# Patient Record
Sex: Female | Born: 1995 | Race: White | Hispanic: No | Smoking: Former smoker
Health system: Southern US, Community
[De-identification: ages and names within clinical notes are randomized; demographics above are authoritative.]

## PROBLEM LIST (undated history)

## (undated) DIAGNOSIS — F419 Anxiety disorder, unspecified: Secondary | ICD-10-CM

## (undated) DIAGNOSIS — R1110 Vomiting, unspecified: Secondary | ICD-10-CM

## (undated) DIAGNOSIS — F4310 Post-traumatic stress disorder, unspecified: Secondary | ICD-10-CM

## (undated) DIAGNOSIS — F329 Major depressive disorder, single episode, unspecified: Secondary | ICD-10-CM

## (undated) DIAGNOSIS — J45909 Unspecified asthma, uncomplicated: Secondary | ICD-10-CM

## (undated) DIAGNOSIS — F3181 Bipolar II disorder: Secondary | ICD-10-CM

## (undated) DIAGNOSIS — N809 Endometriosis, unspecified: Secondary | ICD-10-CM

## (undated) DIAGNOSIS — F32A Depression, unspecified: Secondary | ICD-10-CM

## (undated) HISTORY — DX: Bipolar II disorder: F31.81

## (undated) HISTORY — DX: Depression, unspecified: F32.A

## (undated) HISTORY — DX: Anxiety disorder, unspecified: F41.9

## (undated) HISTORY — DX: Vomiting, unspecified: R11.10

## (undated) HISTORY — DX: Post-traumatic stress disorder, unspecified: F43.10

## (undated) HISTORY — PX: NO PAST SURGERIES: SHX2092

---
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
## 2019-12-04 ENCOUNTER — Ambulatory Visit (INDEPENDENT_AMBULATORY_CARE_PROVIDER_SITE_OTHER): Payer: BC Managed Care – PPO | Admitting: Advanced Practice Midwife

## 2019-12-04 ENCOUNTER — Other Ambulatory Visit: Payer: Self-pay

## 2019-12-04 ENCOUNTER — Encounter: Payer: Self-pay | Admitting: Advanced Practice Midwife

## 2019-12-04 VITALS — BP 118/74 | Wt 141.0 lb

## 2019-12-04 DIAGNOSIS — Z3402 Encounter for supervision of normal first pregnancy, second trimester: Secondary | ICD-10-CM | POA: Insufficient documentation

## 2019-12-04 DIAGNOSIS — Z3A21 21 weeks gestation of pregnancy: Secondary | ICD-10-CM

## 2019-12-04 NOTE — Progress Notes (Signed)
New Obstetric Patient H&P  Transfer of care from [facility]: "Desires prenatal care"   History of Present Illness: Patient is a 24 y.o. G1P0 Not Hispanic or Courtney Ramos female, presents with amenorrhea and positive home pregnancy test. Patient's last menstrual period was 06/28/2019. and based on 6 week ultrasound per patient, her EDD is Estimated Date of Delivery: 04/14/20 and her EGA is [redacted]w[redacted]d. Cycles are 3-4 days, regular, and occur approximately every : 28 days. Her last pap smear was about 6 months ago and was no abnormalities.    She had a urine pregnancy test which was positive 4 month(s)  ago. Her last menstrual period was normal and lasted for  3 or 4 day(s). Since her LMP she claims she has experienced fatigue, nausea, vomiting. She denies vaginal bleeding. Her past medical history is contributory for Bipolar, PTSD, depression. She has completed a treatment program that she reports was very helpful giving her good coping skills. She reports a history of endometriosis and was told she would be unable to conceive. This is her first pregnancy.  Since her LMP, she admits to the use of tobacco products  no She claims she has gained   1 pounds since the start of her pregnancy. She initially lost 20 pounds due to hyperemesis and has since gained that back plus 1 pound. There are cats in the home in the home  yes If yes Indoor- her fiance takes care of litter box She admits close contact with children on a regular basis  no  She has had chicken pox in the past no She has had Tuberculosis exposures, symptoms, or previously tested positive for TB   no Current or past history of domestic violence. no  Genetic Screening/Teratology Counseling: (Includes patient, baby's father, or anyone in either family with:)   38. Patient's age >/= 49 [facility]  no 2. Thalassemia ([place], [place], [place], or Asian background): MCV<80  no 3. Neural tube defect (meningomyelocele, spina bifida,  anencephaly)  no 4. Congenital heart defect  no  5. Down syndrome  no 6. Tay-Sachs (Jewish, [place])  no 7. Canavan's Disease  no 8. Sickle cell disease or trait (African)  no  9. Hemophilia or other blood disorders  no  10. Muscular dystrophy  no  11. Cystic fibrosis  no  12. Huntington's Chorea  no  13. Mental retardation/autism  no 14. Other inherited genetic or chromosomal disorder  no 15. Maternal metabolic disorder (DM, PKU, etc)  no 16. Patient or FOB with a child with a birth defect not listed above no  16a. Patient or FOB with a birth defect themselves no 17. Recurrent pregnancy loss, or stillbirth  no  18. Any medications since LMP other than prenatal vitamins (include vitamins, supplements, OTC meds, drugs, alcohol)  She quit taking Depakote, Propranolol and Maxalt when she found out she was pregnant. She does take Atarax PRN, Fioricet and Magnesium supplement for headaches 19. Any other genetic/environmental exposure to discuss  no  Infection History:   1. Lives with someone with TB or TB exposed  no  2. Patient or partner has history of genital herpes  no 3. Rash or viral illness since LMP  no 4. History of STI (GC, CT, HPV, syphilis, HIV)  no 5. History of recent travel :  no  Other pertinent information:  no    Review of Systems:10 point review of systems negative unless otherwise noted in HPI  Past Medical History:  Patient Active  Problem List   Diagnosis Date Noted  . Encounter for supervision of normal first pregnancy in second trimester 12/04/2019  . Allergic rhinitis 12/03/2015  . 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 Headache, migraine 12/03/2015    Past Surgical History:  Past Surgical History:  Procedure Laterality Date  . NO PAST SURGERIES      Gynecologic History: Patient's last menstrual period was 06/28/2019.  Obstetric History: G1P0  Family History:  History reviewed. No pertinent family history.  Social History:  Social  History   Socioeconomic History  . Marital status: Single    Spouse name: Not on file  . Number of children: Not on file  . Years of education: Not on file  . Highest education level: Not on file  Occupational History  . Not on file  Tobacco Use  . Smoking status: Former Smoker    Types: Cigarettes  . Smokeless tobacco: Never Used  . Tobacco comment: stopped before pregnancy  Substance and Sexual Activity  . Alcohol use: Never  . Drug use: Never  . Sexual activity: Yes    Birth control/protection: None  Other Topics Concern  . Not on file  Social History Narrative  . Not on file   Social Determinants of Health   Financial Resource Strain:   . Difficulty of Paying Living Expenses:   Food Insecurity:   . Worried About Programme researcher, broadcasting/film/video in the Last Year:   . Barista in the Last Year:   Transportation Needs:   . Freight forwarder (Medical):   Marland Kitchen Lack of Transportation (Non-Medical):   Physical Activity:   . Days of Exercise per Week:   . Minutes of Exercise per Session:   Stress:   . Feeling of Stress :   Social Connections:   . Frequency of Communication with Friends and Family:   . Frequency of Social Gatherings with Friends and Family:   . Attends Religious Services:   . Active Member of Clubs or Organizations:   . Attends Banker Meetings:   Marland Kitchen Marital Status:   Intimate Partner Violence:   . Fear of Current or Ex-Partner:   . Emotionally Abused:   Marland Kitchen Physically Abused:   . Sexually Abused:     Allergies:  No Known Allergies  Medications: Prior to Admission medications   Medication Sig Start Date End Date Taking? Authorizing Provider  hydrOXYzine (ATARAX/VISTARIL) 50 MG tablet  12/01/19  Yes [provider]  magnesium 30 MG tablet Take 30 mg by mouth 2 (two) times daily.   Yes [provider]  Prenatal Vit-Fe Fumarate-FA (MULTIVITAMIN-PRENATAL) 27-0.8 MG TABS tablet Take 1 tablet by mouth daily at 12 noon.   Yes  [provider]  butalbital-acetaminophen-caffeine (FIORICET) [phone] MG tablet  11/29/19   [provider]    Physical Exam Vitals: Blood pressure 118/74, weight 141 lb (64 kg), last menstrual period 06/28/2019.  General: NAD HEENT: normocephalic, anicteric Thyroid: no enlargement, no palpable nodules Pulmonary: No increased work of breathing, CTAB Cardiovascular: RRR, distal pulses 2+ Abdomen: NABS, soft, non-tender, non-distended.  Umbilicus without lesions.  No hepatomegaly, splenomegaly or masses palpable. No evidence of hernia  Genitourinary: deferred for no concerns/all NOB labs have been done including PAPtima Extremities: no edema, erythema, or tenderness Neurologic: Grossly intact Psychiatric: mood appropriate, affect full   Assessment: 24 y.o. G1P0 at [redacted]w[redacted]d by ultrasound presenting to initiate prenatal care with [facility]  Plan: 1) Avoid alcoholic beverages. 2) Patient encouraged not to smoke.  3) Discontinue the use of all non-medicinal drugs and chemicals.  4) Take prenatal vitamins daily.  5) Nutrition, food safety (fish, cheese advisories, and high nitrite foods) and exercise discussed. 6) Hospital and practice style discussed with cross coverage system.  7) Genetic Screening, such as with 1st Trimester Screening, cell free fetal DNA, AFP testing, and Ultrasound, as well as with amniocentesis and CVS as appropriate, is discussed with patient. At the conclusion of today's visit patient declined genetic testing. She had previously declined at former provider practice 8) Patient is asked about travel to areas at risk for the [place] virus, and counseled to avoid travel and exposure to mosquitoes or sexual partners who may have themselves been exposed to the virus. Testing is discussed, and will be ordered as appropriate. 9) Request for records signed and sent: will update chart when records arrive 10) Return to clinic in 1 week for anatomy scan and rob     Tresea Mall, CNM [facility] 12/04/2019, 11:57 AM

## 2019-12-04 NOTE — Patient Instructions (Signed)
Exercise During Pregnancy Exercise is an important part of being healthy for people of all ages. Exercise improves the function of your heart and lungs and helps you maintain strength, flexibility, and a healthy body weight. Exercise also boosts energy levels and elevates mood. Most women should exercise regularly during pregnancy. In rare cases, women with certain medical conditions or complications may be asked to limit or avoid exercise during pregnancy. How does this affect me? Along with maintaining general strength and flexibility, exercising during pregnancy can help:  Keep strength in muscles that are used during labor and childbirth.  Decrease low back pain.  Reduce symptoms of depression.  Control weight gain during pregnancy.  Reduce the risk of needing insulin if you develop diabetes during pregnancy.  Decrease the risk of cesarean delivery.  Speed up your recovery after giving birth. How does this affect my baby? Exercise can help you have a healthy pregnancy. Exercise does not cause premature birth. It will not cause your baby to weigh less at birth. What exercises can I do? Many exercises are safe for you to do during pregnancy. Do a variety of exercises that safely increase your heart and breathing rates and help you build and maintain muscle strength. Do exercises exactly as told by your health care provider. You may do these exercises:  Walking or hiking.  Swimming.  Water aerobics.  Riding a stationary bike.  Strength training.  Modified yoga or Pilates. Tell your instructor that you are pregnant. Avoid overstretching, and avoid lying on your back for long periods of time.  Running or jogging. Only choose this type of exercise if you: ? Ran or jogged regularly before your pregnancy. ? Can run or jog and still talk in complete sentences. What exercises should I avoid? Depending on your level of fitness and whether you exercised regularly before your  pregnancy, you may be told to limit high-intensity exercise. You can tell that you are exercising at a high intensity if you are breathing much harder and faster and cannot hold a conversation while exercising. You must avoid:  Contact sports.  Activities that put you at risk for falling on or being hit in the belly, such as downhill skiing, water skiing, surfing, rock climbing, cycling, gymnastics, and horseback riding.  Scuba diving.  Skydiving.  Yoga or Pilates in a room that is heated to high temperatures.  Jogging or running, unless you ran or jogged regularly before your pregnancy. While jogging or running, you should always be able to talk in full sentences. Do not run or jog so fast that you are unable to have a conversation.  Do not exercise at more than 6,000 feet above sea level (high elevation) if you are not used to exercising at high elevation. How do I exercise in a safe way?   Avoid overheating. Do not exercise in very high temperatures.  Wear loose-fitting, breathable clothes.  Avoid dehydration. Drink enough water before, during, and after exercise to keep your urine pale yellow.  Avoid overstretching. Because of hormone changes during pregnancy, it is easy to overstretch muscles, tendons, and ligaments during pregnancy.  Start slowly and ask your health care provider to recommend the types of exercise that are safe for you.  Do not exercise to lose weight. Follow these instructions at home:  Exercise on most days or all days of the week. Try to exercise for 30 minutes a day, 5 days a week, unless your health care provider tells you not to.  If   you actively exercised before your pregnancy and you are healthy, your health care provider may tell you to continue to do moderate to high-intensity exercise.  If you are just starting to exercise or did not exercise much before your pregnancy, your health care provider may tell you to do low to moderate-intensity  exercise. Questions to ask your health care provider  Is exercise safe for me?  What are signs that I should stop exercising?  Does my health condition mean that I should not exercise during pregnancy?  When should I avoid exercising during pregnancy? Stop exercising and contact a health care provider if: You have any unusual symptoms, such as:  Mild contractions of the uterus or cramps in the abdomen.  Dizziness that does not go away when you rest. Stop exercising and get help right away if: You have any unusual symptoms, such as:  Sudden, severe pain in your low back or your belly.  Mild contractions of the uterus or cramps in the abdomen that do not improve with rest and drinking fluids.  Chest pain.  Bleeding or fluid leaking from your vagina.  Shortness of breath. These symptoms may represent a serious problem that is an emergency. Do not wait to see if the symptoms will go away. Get medical help right away. Call your local emergency services (911 in the [place] Do not drive yourself to the hospital. Summary  Most women should exercise regularly throughout pregnancy. In rare cases, women with certain medical conditions or complications may be asked to limit or avoid exercise during pregnancy.  Do not exercise to lose weight during pregnancy.  Your health care provider will tell you what level of physical activity is right for you.  Stop exercising and contact a health care provider if you have mild contractions of the uterus or cramps in the abdomen. Get help right away if these contractions or cramps do not improve with rest and drinking fluids.  Stop exercising and get help right away if you have sudden, severe pain in your low back or belly, chest pain, shortness of breath, or bleeding or leaking of fluid from your vagina. This information is not intended to replace advice given to you by your health care provider. Make sure you discuss any questions you have with your  health care provider. Document Revised: 10/11/2018 Document Reviewed: 07/25/2018 Elsevier Patient Education  2020 [employer] Eating Plan for Pregnant Women While you are pregnant, your body requires additional nutrition to help support your growing baby. You also have a higher need for some vitamins and minerals, such as folic acid, calcium, iron, and vitamin D. Eating a healthy, well-balanced diet is very important for your health and your baby's health. Your need for extra calories varies for the three 3-month segments of your pregnancy (trimesters). For most women, it is recommended to consume:  150 extra calories a day during the first trimester.  300 extra calories a day during the second trimester.  300 extra calories a day during the third trimester. What are tips for following this plan?   Do not try to lose weight or go on a diet during pregnancy.  Limit your overall intake of foods that have "empty calories." These are foods that have little nutritional value, such as sweets, desserts, candies, and sugar-sweetened beverages.  Eat a variety of foods (especially fruits and vegetables) to get a full range of vitamins and minerals.  Take a prenatal vitamin to help meet your additional vitamin and mineral needs   during pregnancy, specifically for folic acid, iron, calcium, and vitamin D.  Remember to stay active. Ask your health care provider what types of exercise and activities are safe for you.  Practice good food safety and cleanliness. Wash your hands before you eat and after you prepare raw meat. Wash all fruits and vegetables well before peeling or eating. Taking these actions can help to prevent food-borne illnesses that can be very dangerous to your baby, such as listeriosis. Ask your health care provider for more information about listeriosis. What does 150 extra calories look like? Healthy options that provide 150 extra calories each day could be any of the  following:  6-8 oz (170-230 g) of plain low-fat yogurt with  cup of berries.  1 apple with 2 teaspoons (11 g) of peanut butter.  Cut-up vegetables with  cup (60 g) of hummus.  8 oz (230 mL) or 1 cup of low-fat chocolate milk.  1 stick of string cheese with 1 medium orange.  1 peanut butter and jelly sandwich that is made with one slice of whole-wheat bread and 1 tsp (5 g) of peanut butter. For 300 extra calories, you could eat two of those healthy options each day. What is a healthy amount of weight to gain? The right amount of weight gain for you is based on your BMI before you became pregnant. If your BMI:  Was less than 18 (underweight), you should gain 28-40 lb (13-18 kg).  Was 18-24.9 (normal), you should gain 25-35 lb (11-16 kg).  Was 25-29.9 (overweight), you should gain 15-25 lb (7-11 kg).  Was 30 or greater (obese), you should gain 11-20 lb (5-9 kg). What if I am having twins or multiples? Generally, if you are carrying twins or multiples:  You may need to eat 300-600 extra calories a day.  The recommended range for total weight gain is 25-54 lb (11-25 kg), depending on your BMI before pregnancy.  Talk with your health care provider to find out about nutritional needs, weight gain, and exercise that is right for you. What foods can I eat?  Fruits All fruits. Eat a variety of colors and types of fruit. Remember to wash your fruits well before peeling or eating. Vegetables All vegetables. Eat a variety of colors and types of vegetables. Remember to wash your vegetables well before peeling or eating. Grains All grains. Choose whole grains, such as whole-wheat bread, oatmeal, or brown rice. Meats and other protein foods Lean meats, including chicken, turkey, fish, and lean cuts of beef, veal, or pork. If you eat fish or seafood, choose options that are higher in omega-3 fatty acids and lower in mercury, such as salmon, herring, mussels, trout, sardines, pollock,  shrimp, crab, and lobster. Tofu. Tempeh. Beans. Eggs. Peanut butter and other nut butters. Make sure that all meats, poultry, and eggs are cooked to food-safe temperatures or "well-done." Two or more servings of fish are recommended each week in order to get the most benefits from omega-3 fatty acids that are found in seafood. Choose fish that are lower in mercury. You can find more information online:  www.fda.gov Dairy Pasteurized milk and milk alternatives (such as almond milk). Pasteurized yogurt and pasteurized cheese. Cottage cheese. Sour cream. Beverages Water. Juices that contain 100% fruit juice or vegetable juice. Caffeine-free teas and decaffeinated coffee. Drinks that contain caffeine are okay to drink, but it is better to avoid caffeine. Keep your total caffeine intake to less than 200 mg each day (which is 12 oz   or 355 mL of coffee, tea, or soda) or the limit as told by your health care provider. Fats and oils Fats and oils are okay to include in moderation. Sweets and desserts Sweets and desserts are okay to include in moderation. Seasoning and other foods All pasteurized condiments. The items listed above may not be a complete list of foods and beverages you can eat. Contact a dietitian for more information. What foods are not recommended? Fruits Unpasteurized fruit juices. Vegetables Raw (unpasteurized) vegetable juices. Meats and other protein foods Lunch meats, bologna, hot dogs, or other deli meats. (If you must eat those meats, reheat them until they are steaming hot.) Refrigerated pat, meat spreads from a meat counter, smoked seafood that is found in the refrigerated section of a store. Raw or undercooked meats, poultry, and eggs. Raw fish, such as sushi or sashimi. Fish that have high mercury content, such as tilefish, shark, swordfish, and king mackerel. To learn more about mercury in fish, talk with your health care provider or look for online resources, such  as:  www.fda.gov Dairy Raw (unpasteurized) milk and any foods that have raw milk in them. Soft cheeses, such as feta, queso blanco, queso fresco, Brie, Camembert cheeses, blue-veined cheeses, and Panela cheese (unless it is made with pasteurized milk, which must be stated on the label). Beverages Alcohol. Sugar-sweetened beverages, such as sodas, teas, or energy drinks. Seasoning and other foods Homemade fermented foods and drinks, such as pickles, sauerkraut, or kombucha drinks. (Store-bought pasteurized versions of these are okay.) Salads that are made in a store or deli, such as ham salad, chicken salad, egg salad, tuna salad, and seafood salad. The items listed above may not be a complete list of foods and beverages you should avoid. Contact a dietitian for more information. Where to find more information To calculate the number of calories you need based on your height, weight, and activity level, you can use an online calculator such as:  www.choosemyplate.gov/MyPlatePlan To calculate how much weight you should gain during pregnancy, you can use an online pregnancy weight gain calculator such as:  www.choosemyplate.gov/pregnancy-weight-gain-calculator Summary  While you are pregnant, your body requires additional nutrition to help support your growing baby.  Eat a variety of foods, especially fruits and vegetables to get a full range of vitamins and minerals.  Practice good food safety and cleanliness. Wash your hands before you eat and after you prepare raw meat. Wash all fruits and vegetables well before peeling or eating. Taking these actions can help to prevent food-borne illnesses, such as listeriosis, that can be very dangerous to your baby.  Do not eat raw meat or fish. Do not eat fish that have high mercury content, such as tilefish, shark, swordfish, and king mackerel. Do not eat unpasteurized (raw) dairy.  Take a prenatal vitamin to help meet your additional vitamin and  mineral needs during pregnancy, specifically for folic acid, iron, calcium, and vitamin D. This information is not intended to replace advice given to you by your health care provider. Make sure you discuss any questions you have with your health care provider. Document Revised: 11/08/2018 Document Reviewed: 03/17/2017 Elsevier Patient Education  2020 [employer]. Prenatal Care Prenatal care is health care during pregnancy. It helps you and your unborn baby (fetus) stay as healthy as possible. Prenatal care may be provided by a midwife, a family practice health care provider, or a childbirth and pregnancy specialist (obstetrician). How does this affect me? During pregnancy, you will be closely monitored   for any new conditions that might develop. To lower your risk of pregnancy complications, you and your health care provider will talk about any underlying conditions you have. How does this affect my baby? Early and consistent prenatal care increases the chance that your baby will be healthy during pregnancy. Prenatal care lowers the risk that your baby will be:  Born early (prematurely).  Smaller than expected at birth (small for gestational age). What can I expect at the first prenatal care visit? Your first prenatal care visit will likely be the longest. You should schedule your first prenatal care visit as soon as you know that you are pregnant. Your first visit is a good time to talk about any questions or concerns you have about pregnancy. At your visit, you and your health care provider will talk about:  Your medical history, including: ? Any past pregnancies. ? Your family's medical history. ? The baby's father's medical history. ? Any long-term (chronic) health conditions you have and how you manage them. ? Any surgeries or procedures you have had. ? Any current over-the-counter or prescription medicines, herbs, or supplements you are taking.  Other factors that could pose a risk  to your baby, including:  Your home setting and your stress levels, including: ? Exposure to abuse or violence. ? Household financial strain. ? Mental health conditions you have.  Your daily health habits, including diet and exercise. Your health care provider will also:  Measure your weight, height, and blood pressure.  Do a physical exam, including a pelvic and breast exam.  Perform blood tests and urine tests to check for: ? Urinary tract infection. ? Sexually transmitted infections (STIs). ? Low iron levels in your blood (anemia). ? Blood type and certain proteins on red blood cells (Rh antibodies). ? Infections and immunity to viruses, such as hepatitis B and rubella. ? HIV (human immunodeficiency virus).  Do an ultrasound to confirm your baby's growth and development and to help predict your estimated due date (EDD). This ultrasound is done with a probe that is inserted into the vagina (transvaginal ultrasound).  Discuss your options for genetic screening.  Give you information about how to keep yourself and your baby healthy, including: ? Nutrition and taking vitamins. ? Physical activity. ? How to manage pregnancy symptoms such as nausea and vomiting (morning sickness). ? Infections and substances that may be harmful to your baby and how to avoid them. ? Food safety. ? Dental care. ? Working. ? Travel. ? Warning signs to watch for and when to call your health care provider. How often will I have prenatal care visits? After your first prenatal care visit, you will have regular visits throughout your pregnancy. The visit schedule is often as follows:  Up to week 28 of pregnancy: once every 4 weeks.  28-36 weeks: once every 2 weeks.  After 36 weeks: every week until delivery. Some women may have visits more or less often depending on any underlying health conditions and the health of the baby. Keep all follow-up and prenatal care visits as told by your health care  provider. This is important. What happens during routine prenatal care visits? Your health care provider will:  Measure your weight and blood pressure.  Check for fetal heart sounds.  Measure the height of your uterus in your abdomen (fundal height). This may be measured starting around week 20 of pregnancy.  Check the position of your baby inside your uterus.  Ask questions about your diet, sleeping patterns, and   whether you can feel the baby move.  Review warning signs to watch for and signs of labor.  Ask about any pregnancy symptoms you are having and how you are dealing with them. Symptoms may include: ? Headaches. ? Nausea and vomiting. ? Vaginal discharge. ? Swelling. ? Fatigue. ? Constipation. ? Any discomfort, including back or pelvic pain. Make a list of questions to ask your health care provider at your routine visits. What tests might I have during prenatal care visits? You may have blood, urine, and imaging tests throughout your pregnancy, such as:  Urine tests to check for glucose, protein, or signs of infection.  Glucose tests to check for a form of diabetes that can develop during pregnancy (gestational diabetes mellitus). This is usually done around week 24 of pregnancy.  An ultrasound to check your baby's growth and development and to check for birth defects. This is usually done around week 20 of pregnancy.  A test to check for group B strep (GBS) infection. This is usually done around week 36 of pregnancy.  Genetic testing. This may include blood or imaging tests, such as an ultrasound. Some genetic tests are done during the first trimester and some are done during the second trimester. What else can I expect during prenatal care visits? Your health care provider may recommend getting certain vaccines during pregnancy. These may include:  A yearly flu shot (annual influenza vaccine). This is especially important if you will be pregnant during flu  season.  Tdap (tetanus, diphtheria, pertussis) vaccine. Getting this vaccine during pregnancy can protect your baby from whooping cough (pertussis) after birth. This vaccine may be recommended between weeks 27 and 36 of pregnancy. Later in your pregnancy, your health care provider may give you information about:  Childbirth and breastfeeding classes.  Choosing a health care provider for your baby.  Umbilical cord banking.  Breastfeeding.  Birth control after your baby is born.  The hospital labor and delivery unit and how to tour it.  Registering at the hospital before you go into labor. Where to find more information  Office on Women's Health: womenshealth.gov  American Pregnancy Association: americanpregnancy.org  March of Dimes: marchofdimes.org Summary  Prenatal care helps you and your baby stay as healthy as possible during pregnancy.  Your first prenatal care visit will most likely be the longest.  You will have visits and tests throughout your pregnancy to monitor your health and your baby's health.  Bring a list of questions to your visits to ask your health care provider.  Make sure to keep all follow-up and prenatal care visits with your health care provider. This information is not intended to replace advice given to you by your health care provider. Make sure you discuss any questions you have with your health care provider. Document Revised: 10/10/2018 Document Reviewed: 06/19/2017 Elsevier Patient Education  2020 [employer]  

## 2019-12-04 NOTE — Progress Notes (Signed)
NOB today, transfer of care. Need anatomy scan.

## 2019-12-07 ENCOUNTER — Other Ambulatory Visit: Payer: Self-pay

## 2019-12-07 ENCOUNTER — Emergency Department
Admission: EM | Admit: 2019-12-07 | Discharge: 2019-12-07 | Disposition: A | Discharge status: Home / Self Care | Payer: BC Managed Care – PPO | Attending: Emergency Medicine | Admitting: Emergency Medicine

## 2019-12-07 DIAGNOSIS — R05 Cough: Secondary | ICD-10-CM | POA: Diagnosis present

## 2019-12-07 DIAGNOSIS — Z87891 Personal history of nicotine dependence: Secondary | ICD-10-CM | POA: Insufficient documentation

## 2019-12-07 DIAGNOSIS — J029 Acute pharyngitis, unspecified: Secondary | ICD-10-CM | POA: Insufficient documentation

## 2019-12-07 DIAGNOSIS — H65111 Acute and subacute allergic otitis media (mucoid) (sanguinous) (serous), right ear: Secondary | ICD-10-CM | POA: Diagnosis not present

## 2019-12-07 DIAGNOSIS — Z20822 Contact with and (suspected) exposure to covid-19: Secondary | ICD-10-CM | POA: Diagnosis not present

## 2019-12-07 DIAGNOSIS — J069 Acute upper respiratory infection, unspecified: Secondary | ICD-10-CM | POA: Diagnosis not present

## 2019-12-07 LAB — GROUP A STREP BY PCR: Group A Strep by PCR: NOT DETECTED

## 2019-12-07 LAB — SARS CORONAVIRUS 2 BY RT PCR (HOSPITAL ORDER, PERFORMED IN ~~LOC~~ HOSPITAL LAB): SARS Coronavirus 2: NEGATIVE

## 2019-12-07 MED ORDER — PROMETHAZINE-CODEINE 6.25-10 MG/5ML PO SYRP: 5.0000 mL | ORAL_SOLUTION | Freq: Four times a day (QID) | ORAL | 0 refills | Status: DC | PRN

## 2019-12-07 MED ORDER — AMOXICILLIN 500 MG PO CAPS
500.0000 mg | ORAL_CAPSULE | Freq: Three times a day (TID) | ORAL | 0 refills | Status: DC
Start: 2019-12-07 — End: 2020-01-07

## 2019-12-07 NOTE — ED Notes (Signed)
See triage note- pt [redacted] weeks pregnant, states she is having upper respiratory symptoms and some shortness of breath when she has coughing fits- states she can breathe fine with resting and sitting straight up. Pt in NAD at this time.

## 2019-12-07 NOTE — Discharge Instructions (Addendum)
Follow discharge care instruction take medication as directed.  Advised self quarantine pending results of COVID-19 test.  If test is positive quarantine additional 10 days.  Test results may be found in the MyChart app.

## 2019-12-07 NOTE — ED Provider Notes (Signed)
[facility] Emergency Department Provider Note   ____________________________________________   First MD Initiated Contact with Patient 12/07/19 1123     (approximate)  I have reviewed the triage vital signs and the nursing notes.   HISTORY  Chief Complaint URI    HPI Courtney Ramos is a 24 y.o. female patient complain of cough, bilateral ear pain, sore throat, shortness of breath for 3 days.  Patient is [redacted] weeks gestation with a recent relocation last month to this city patient is G1, P0.  Patient denies known contact with COVID-19.  Patient denies fever associated with complaint.  Patient denies nausea, vomiting, diarrhea.  Patient states cough is productive.         Past Medical History:  Diagnosis Date  . Anxiety   . Bipolar 2 disorder ([place])   . Depression   . Hyperemesis   . PTSD (post-traumatic stress disorder)     Patient Active Problem List   Diagnosis Date Noted  . Encounter for supervision of normal first pregnancy in second trimester 12/04/2019  . Allergic rhinitis 12/03/2015  . 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 Headache, migraine 12/03/2015    Past Surgical History:  Procedure Laterality Date  . NO PAST SURGERIES      Prior to Admission medications   Medication Sig Start Date End Date Taking? Authorizing Provider  amoxicillin (AMOXIL) 500 MG capsule Take 1 capsule (500 mg total) by mouth 3 (three) times daily. 12/07/19   Sable Feil, PA-C  butalbital-acetaminophen-caffeine (FIORICET) [phone] MG tablet  11/29/19   [provider]  hydrOXYzine (ATARAX/VISTARIL) 50 MG tablet  12/01/19   [provider]  magnesium 30 MG tablet Take 30 mg by mouth 2 (two) times daily.    [provider]  Prenatal Vit-Fe Fumarate-FA (MULTIVITAMIN-PRENATAL) 27-0.8 MG TABS tablet Take 1 tablet by mouth daily at 12 noon.    [provider]  promethazine-codeine (PHENERGAN WITH CODEINE) 6.25-10 MG/5ML syrup Take  5 mLs by mouth every 6 (six) hours as needed for cough. 12/07/19   Sable Feil, PA-C    Allergies Patient has no known allergies.  History reviewed. No pertinent family history.  Social History Social History   Tobacco Use  . Smoking status: Former Smoker    Types: Cigarettes  . Smokeless tobacco: Never Used  . Tobacco comment: stopped before pregnancy  Substance Use Topics  . Alcohol use: Never  . Drug use: Never    Review of Systems  Constitutional: No fever/chills Eyes: No visual changes. ENT: Sore throat and bilateral ear pain. Cardiovascular: Denies chest pain. Respiratory: Denies shortness of breath.  Productive cough. Gastrointestinal: No abdominal pain.  No nausea, no vomiting.  No diarrhea.  No constipation. Genitourinary: Negative for dysuria. Musculoskeletal: Negative for back pain. Skin: Negative for rash. Neurological: Negative for headaches, focal weakness or numbness.   ____________________________________________   PHYSICAL EXAM:  VITAL SIGNS: ED Triage Vitals  Enc Vitals Group     BP 12/07/19 1058 134/67     Pulse Rate 12/07/19 1058 (!) 102     Resp 12/07/19 1058 16     Temp 12/07/19 1058 98.8 F (37.1 C)     Temp Source 12/07/19 1058 Oral     SpO2 12/07/19 1058 96 %     Weight 12/07/19 1058 141 lb (64 kg)     Height 12/07/19 1058 5\' 1"  (1.549 m)     Head Circumference --      Peak Flow --  Pain Score 12/07/19 1105 6     Pain Loc --      Pain Edu? --      Excl. in GC? --    Constitutional: Alert and oriented. Well appearing and in no acute distress. Eyes: Conjunctivae are normal. PERRL. EOMI. Head: Atraumatic. Nose: No congestion/rhinnorhea. EARS: Edematous and erythematous right TM. Mouth/Throat: Mucous membranes are moist.  Oropharynx erythematous. Neck: No stridor.  Hematological/Lymphatic/Immunilogical: No cervical lymphadenopathy. Cardiovascular: Tachycardic, regular rhythm. Grossly normal heart sounds.  Good peripheral  circulation. Respiratory: Normal respiratory effort.  No retractions. Lungs CTAB. Gastrointestinal: Soft and nontender. No distention. No abdominal bruits. No CVA tenderness. Skin:  Skin is warm, dry and intact. No rash noted. Psychiatric: Mood and affect are normal. Speech and behavior are normal.  ____________________________________________   LABS (all labs ordered are listed, but only abnormal results are displayed)  Labs Reviewed  GROUP A STREP BY PCR  SARS CORONAVIRUS 2 BY RT PCR (HOSPITAL ORDER, PERFORMED IN [facility])   ____________________________________________  EKG   ____________________________________________  RADIOLOGY  ED MD interpretation:    Official radiology report(s): No results found.  ____________________________________________   PROCEDURES  Procedure(s) performed (including Critical Care):  Procedures   ____________________________________________   INITIAL IMPRESSION / ASSESSMENT AND PLAN / ED COURSE  As part of my medical decision making, I reviewed the following data within the electronic MEDICAL RECORD NUMBER     Presents with productive cough, bilateral ear pain, mild dyspnea with exertion, sore throat for 3 days.  Discussed negative strep results with patient.  Patient complaint physical exam is consistent with otitis media of the right ear, viral pharyngitis, viral respiratory infection.  Patient given discharge care instructions.  Patient advised to self quarantine pending results of COVID-19 test.  Take medication as directed.    Courtney Ramos was evaluated in Emergency Department on 12/07/2019 for the symptoms described in the history of present illness. She was evaluated in the context of the global COVID-19 pandemic, which necessitated consideration that the patient might be at risk for infection with the SARS-CoV-2 virus that causes COVID-19. Institutional protocols and algorithms that pertain to the evaluation of patients  at risk for COVID-19 are in a state of rapid change based on information released by regulatory bodies including the CDC and federal and state organizations. These policies and algorithms were followed during the patient's care in the ED.       ____________________________________________   FINAL CLINICAL IMPRESSION(S) / ED DIAGNOSES  Final diagnoses:  Subacute allergic otitis media of right ear, recurrence not specified  Viral URI with cough  Sore throat     ED Discharge Orders         Ordered    amoxicillin (AMOXIL) 500 MG capsule  3 times daily     12/07/19 1236    promethazine-codeine (PHENERGAN WITH CODEINE) 6.25-10 MG/5ML syrup  Every 6 hours PRN     12/07/19 1236           Note:  This document was prepared using Dragon voice recognition software and may include unintentional dictation errors.    Joni Reining, PA-C 12/07/19 1242    Dionne Bucy, MD 12/07/19 1620

## 2019-12-07 NOTE — ED Triage Notes (Addendum)
Pt arrives via POV from for reports productive cough, bilateral ear pain, shob on exertion and sore throat x 3 days. Pt called her obgyn and was told to come to the ER. Pt speaking with this RN in complete sentences without difficulty, A&Ox4.

## 2019-12-11 ENCOUNTER — Ambulatory Visit (INDEPENDENT_AMBULATORY_CARE_PROVIDER_SITE_OTHER): Payer: BC Managed Care – PPO

## 2019-12-11 ENCOUNTER — Ambulatory Visit (INDEPENDENT_AMBULATORY_CARE_PROVIDER_SITE_OTHER): Payer: BC Managed Care – PPO | Admitting: Obstetrics

## 2019-12-11 ENCOUNTER — Other Ambulatory Visit: Payer: Self-pay

## 2019-12-11 VITALS — BP 100/70 | Wt 138.0 lb

## 2019-12-11 DIAGNOSIS — Z3402 Encounter for supervision of normal first pregnancy, second trimester: Secondary | ICD-10-CM

## 2019-12-11 DIAGNOSIS — Z3A22 22 weeks gestation of pregnancy: Secondary | ICD-10-CM

## 2019-12-11 LAB — POCT URINALYSIS DIPSTICK OB
Glucose, UA: NEGATIVE
POC,PROTEIN,UA: NEGATIVE

## 2019-12-11 NOTE — Patient Instructions (Signed)

## 2019-12-11 NOTE — Progress Notes (Signed)
°  Routine Prenatal Care Visit  Subjective  Courtney Ramos is a 24 y.o. G1P0 at [redacted]w[redacted]d being seen today for ongoing prenatal care.  She is currently monitored for the following issues for this low-risk pregnancy and has Allergic rhinitis; Airway hyperreactivity; Headache, migraine; and Encounter for supervision of normal first pregnancy in second trimester on their problem list.  ----------------------------------------------------------------------------------- Patient reports no complaints.    . Vag. Bleeding: None.  Movement: Present. Leaking Fluid denies.  ----------------------------------------------------------------------------------- The following portions of the patient's history were reviewed and updated as appropriate: allergies, current medications, past family history, past medical history, past social history, past surgical history and problem list. Problem list updated.  Objective  Blood pressure 100/70, weight 138 lb (62.6 kg), last menstrual period 06/28/2019. Pregravid weight 140 lb (63.5 kg) Total Weight Gain -2 lb (-0.907 kg) Urinalysis: Urine Protein Negative  Urine Glucose Negative  Fetal Status:     Movement: Present     General:  Alert, oriented and cooperative. Patient is in no acute distress.  Skin: Skin is warm and dry. No rash noted.   Cardiovascular: Normal heart rate noted  Respiratory: Normal respiratory effort, no problems with respiration noted  Abdomen: Soft, gravid, appropriate for gestational age.       Pelvic:  Cervical exam deferred        Extremities: Normal range of motion.     Mental Status: Normal mood and affect. Normal behavior. Normal judgment and thought content.   Assessment   24 y.o. G1P0 at [redacted]w[redacted]d by  04/14/2020, by Ultrasound presenting for routine prenatal visit  Plan   pregnancy Problems (from 12/04/19 to present)    No problems associated with this episode.       Preterm labor symptoms and general obstetric precautions including  but not limited to vaginal bleeding, contractions, leaking of fluid and fetal movement were reviewed in detail with the patient. Please refer to After Visit Summary for other counseling recommendations. She is a transfer from [place]. Her prenatal records have been reviewed.Discussed her bipolar dx and invited her to notify the practice should she becoe symptomatic as she has stopped all her medication for mood. CBE classes and breastfeeding class encouraged. RTC in 2 weeks to meet other provider (wants to meet many providers as a later transfer in)  Return in about 2 weeks (around 01/08/2020) for return OB.  Mirna Mires, CNM  12/11/2019 5:36 PM

## 2019-12-12 ENCOUNTER — Other Ambulatory Visit: Payer: Self-pay | Admitting: Obstetrics

## 2019-12-12 ENCOUNTER — Telehealth: Payer: Self-pay

## 2019-12-12 DIAGNOSIS — R058 Other specified cough: Secondary | ICD-10-CM

## 2019-12-12 MED ORDER — PROMETHAZINE-CODEINE 6.25-10 MG/5ML PO SYRP: 5.0000 mL | ORAL_SOLUTION | Freq: Four times a day (QID) | ORAL | Status: DC | PRN

## 2019-12-12 NOTE — Telephone Encounter (Signed)
Patient seen by Claris Che and was expecting rx for Codeine Cough syrup to treat her URI. [facility] has not received the rx. Cb#[phone]

## 2019-12-13 NOTE — Telephone Encounter (Signed)
Patient is calling to follow up on if her prescription was sent to her pharmacy Please advise

## 2019-12-17 ENCOUNTER — Other Ambulatory Visit: Payer: Self-pay | Admitting: Obstetrics

## 2019-12-17 DIAGNOSIS — R058 Other specified cough: Secondary | ICD-10-CM

## 2019-12-17 MED ORDER — PROMETHAZINE-CODEINE 6.25-10 MG/5ML PO SYRP: 5.0000 mL | ORAL_SOLUTION | Freq: Four times a day (QID) | ORAL | 0 refills | Status: DC | PRN

## 2019-12-17 NOTE — Progress Notes (Signed)
Prescription for limited Phenergan-codeine syrup sent in to pharmacy for patient. She is instructed to use this sparingly. Also encouraged her to follow up with a pulonary specialist or PCP for her ongoing cough and URI. Mirna Mires, CNM  12/17/2019 9:38 AM

## 2019-12-21 ENCOUNTER — Other Ambulatory Visit: Payer: Self-pay

## 2019-12-21 ENCOUNTER — Observation Stay
Admission: EM | Admit: 2019-12-21 | Discharge: 2019-12-21 | Disposition: A | Discharge status: Home / Self Care | Payer: BC Managed Care – PPO | Attending: Certified Nurse Midwife | Admitting: Certified Nurse Midwife

## 2019-12-21 ENCOUNTER — Encounter: Payer: Self-pay | Admitting: Obstetrics and Gynecology

## 2019-12-21 DIAGNOSIS — R109 Unspecified abdominal pain: Secondary | ICD-10-CM | POA: Diagnosis present

## 2019-12-21 DIAGNOSIS — Z87891 Personal history of nicotine dependence: Secondary | ICD-10-CM | POA: Diagnosis not present

## 2019-12-21 DIAGNOSIS — O26892 Other specified pregnancy related conditions, second trimester: Principal | ICD-10-CM | POA: Insufficient documentation

## 2019-12-21 DIAGNOSIS — Z3A23 23 weeks gestation of pregnancy: Secondary | ICD-10-CM | POA: Diagnosis not present

## 2019-12-21 HISTORY — DX: Unspecified asthma, uncomplicated: J45.909

## 2019-12-21 HISTORY — DX: Endometriosis, unspecified: N80.9

## 2019-12-21 LAB — URINALYSIS, COMPLETE (UACMP) WITH MICROSCOPIC
Bacteria, UA: NONE SEEN
Bilirubin Urine: NEGATIVE
Glucose, UA: NEGATIVE mg/dL
Hgb urine dipstick: NEGATIVE
Ketones, ur: NEGATIVE mg/dL
Leukocytes,Ua: NEGATIVE
Nitrite: NEGATIVE
Protein, ur: NEGATIVE mg/dL
Specific Gravity, Urine: 1.004 — ABNORMAL LOW (ref 1.005–1.030)
pH: 6 (ref 5.0–8.0)

## 2019-12-21 NOTE — OB Triage Note (Signed)
Pt. Presented to L/D triage with reported abdominal pain and leaking of fluid that began today at 1500. She reports a white/cloudy discharge that soaked through her underwear. No bleeding and positive fetal movement stated. Her pain is intermittent, lower abdominal, rated 4 to 8/10. She reports experiencing yeast infection symptoms that began 3 days prior (vaginal soreness and itching). She took Monistat on 6/18 and is actively on Amoxicillin or a Upper respiratory infection. No urinary symptoms. VSS. Monitors applied and assessing.

## 2019-12-21 NOTE — Final Progress Note (Addendum)
Physician Final Progress Note  Patient ID: Courtney Ramos MRN: [id number] DOB/AGE: 11-26-1995 24 y.o.  Admit date: 12/21/2019 Admitting provider: Malachy Mood, MD / Jesus Genera. Danise Mina, [place] Discharge date: 12/21/2019   Admission Diagnoses: Abdominal pain in the second trimester. Leakage of fluid from the vagina IUP at 23wk4d  Discharge Diagnoses:  Active Problems:   Abdominal pain during pregnancy in second trimester  Resolved  Consults: None  Significant Findings/ Diagnostic Studies:     History of Present Illness:  Chief Complaint:  Complains of sudden onset abdominal pain, leakage of fluid, and vaginal pain while at work.  HPI:  Courtney Ramos is a 24 y.o. G1P0 female with EDC=04/14/2020 at [redacted]w[redacted]d dated by a 6 week ultrasound.  Her pregnancy has been complicated by bipolar disorder/ anxiety/ migraine headaches/ THC use/ asthma, and recurrent or prolonged URI. .  She presents to L&D for evaluation of sudden onset of abdominal pain after she noticed a white cloudy discharge that soaked through her underwear. The lower abdominal pain was accompanied by a tightening of her uterus and the pains came in waves. She rated this pain at a 4-8/10. The pains have resolved since coming to the L&D and lying down. Baby has been moving well. Denies vaginal bleeding. Has been on Amoxicillin for 2-3 weeks (forgets to take them tid as prescribed) for a ear/sinus infection. Developed vulvovaginal itching and used Monistat 7 intravaginally and externally last night. Denies dysuria. Had a large BM last night, no diarrhea.    Prenatal care site: Prenatal care  Begun in [place] and transferred to [facility] 2 weeks ago. Her prenatal care has also been remarkable for    Clinic  Prenatal Labs  Dating  in [place] by 6wk [place] per patient (04/14/20). 10 wk [place] gave EDC=04/15/20) Blood type:   O negative  Genetic Screen 1 Screen:    AFP:     Quad:     NIPS: Antibody: negative  Anatomic [place]  12/11/2019- NML -  female Rubella:  immune  GTT Early:               Third trimester:  RPR:   NR  Flu vaccine  HBsAg:   negative  TDaP vaccine                                               Rhogam: HIV:   NR  Baby Food                Breast                               GBS: (For PCN allergy, check sensitivities)  Contraception  OCPs UKG:URKY  Circumcision NA   Pediatrician    Support Person husband           Maternal Medical History:   Past Medical History:  Diagnosis Date  . Anxiety   . Asthma   . Bipolar 2 disorder ([place])   . Depression   . Endometriosis   . Hyperemesis   . PTSD (post-traumatic stress disorder)     Past Surgical History:  Procedure Laterality Date  . NO PAST SURGERIES      No Known Allergies  Prior to Admission medications   Medication Sig Start Date End Date Taking? Authorizing Provider  amoxicillin (AMOXIL) 500 MG capsule Take 1 capsule (500 mg total) by mouth 3 (three) times daily. 12/07/19  Yes Joni Reining, PA-C  butalbital-acetaminophen-caffeine (FIORICET) [id number] MG tablet  11/29/19  Yes [provider]  magnesium 30 MG tablet Take 30 mg by mouth 2 (two) times daily.   Yes [provider]  Prenatal Vit-Fe Fumarate-FA (MULTIVITAMIN-PRENATAL) 27-0.8 MG TABS tablet Take 1 tablet by mouth daily at 12 noon.   Yes [provider]  promethazine-codeine (PHENERGAN WITH CODEINE) 6.25-10 MG/5ML syrup Take 5 mLs by mouth every 6 (six) hours as needed for cough. 12/17/19  Yes Mirna Mires, CNM  hydrOXYzine (ATARAX/VISTARIL) 50 MG tablet  12/01/19   [provider]          Social History: She  reports that she has quit smoking. Her smoking use included cigarettes. She has never used smokeless tobacco. She reports that she does not drink alcohol and does not use drugs.  Family History: negative for breast and ovarian cancer; negative for diabetes  Review of Systems: Negative x 10 systems reviewed except as noted in the HPI.       Physical Exam:  Vital Signs: BP (!) 113/53 (BP Location: Right Arm)   Pulse 80   Temp 98.5 F (36.9 C) (Oral)   Resp 18   Ht 5\' 1"  (1.549 m)   Wt 64.4 kg   LMP 06/28/2019   BMI 26.83 kg/m  General: gravid WF in no acute distress.  HEENT: normocephalic, atraumatic Heart: regular rate & rhythm.  No murmurs/rubs/gallops Lungs: clear to auscultation bilaterally Abdomen: soft, gravid, mildly TTP in left lower border of uterus Pelvic:   External: mild erythema of labia minora  Vagina: white creamy discharge  Cervix: Dilation: Closed / Effacement (%): Thick /     Wet [place]: - clue cells; - trichomonas;  - yeast   Extremities: non-tender, symmetric, no edema bilaterally.   Neurologic: Alert & oriented x 3.    Baseline FHR: 150 baseline with accelerations to 160, moderate variability Toco: no contractions seen in 30 minutes  Results for orders placed or performed during the hospital encounter of 12/21/19 (from the past 24 hour(s))  Urinalysis, Complete w Microscopic     Status: Abnormal   Collection Time: 12/21/19  4:41 PM  Result Value Ref Range   Color, Urine STRAW (A) YELLOW   APPearance HAZY (A) CLEAR   Specific Gravity, Urine 1.004 (L) 1.005 - 1.030   pH 6.0 5.0 - 8.0   Glucose, UA NEGATIVE NEGATIVE mg/dL   Hgb urine dipstick NEGATIVE NEGATIVE   Bilirubin Urine NEGATIVE NEGATIVE   Ketones, ur NEGATIVE NEGATIVE mg/dL   Protein, ur NEGATIVE NEGATIVE mg/dL   Nitrite NEGATIVE NEGATIVE   Leukocytes,Ua NEGATIVE NEGATIVE   RBC / HPF 0-5 0 - 5 RBC/hpf   WBC, UA 0-5 0 - 5 WBC/hpf   Bacteria, UA NONE SEEN NONE SEEN   Squamous Epithelial / LPF 0-5 0 - 5    Assessment:  Courtney Ramos is a 24 y.o. G1P0 female at [redacted]w[redacted]d with resolved lower abdominal pain No evidence of monilial infection after use of Monistat PV -leakage of fluid was probably the antifungal cream she used yesterday  Plan: Discharge home Encouraged to use Monistat externally only Follow up next week at  [facility] as scheduled  [facility]  12/21/2019 8:11 PM     Procedures: none  Discharge Condition: stable  Disposition: Discharge disposition: 01-Home or Self Care  Diet: Regular diet  Discharge Activity: Activity as tolerated   Allergies as of 12/21/2019   No Known Allergies     Medication List    TAKE these medications   amoxicillin 500 MG capsule Commonly known as: AMOXIL Take 1 capsule (500 mg total) by mouth 3 (three) times daily.   butalbital-acetaminophen-caffeine 50-325-40 MG tablet Commonly known as: FIORICET   hydrOXYzine 50 MG tablet Commonly known as: ATARAX/VISTARIL   magnesium 30 MG tablet Take 30 mg by mouth 2 (two) times daily.   multivitamin-prenatal 27-0.8 MG Tabs tablet Take 1 tablet by mouth daily at 12 noon.   promethazine-codeine 6.25-10 MG/5ML syrup Commonly known as: PHENERGAN with CODEINE Take 5 mLs by mouth every 6 (six) hours as needed for cough.        Total time spent taking care of this patient: 30 minutes  Signed: Farrel Conners 12/21/2019, 8:11 PM

## 2019-12-25 ENCOUNTER — Encounter: Payer: BC Managed Care – PPO | Admitting: Certified Nurse Midwife

## 2020-01-07 ENCOUNTER — Ambulatory Visit (INDEPENDENT_AMBULATORY_CARE_PROVIDER_SITE_OTHER): Payer: BC Managed Care – PPO | Admitting: Certified Nurse Midwife

## 2020-01-07 ENCOUNTER — Other Ambulatory Visit: Payer: Self-pay

## 2020-01-07 VITALS — BP 110/70 | Ht 61.0 in | Wt 151.8 lb

## 2020-01-07 DIAGNOSIS — Z3402 Encounter for supervision of normal first pregnancy, second trimester: Secondary | ICD-10-CM

## 2020-01-07 DIAGNOSIS — Z3A26 26 weeks gestation of pregnancy: Secondary | ICD-10-CM

## 2020-01-07 LAB — POCT URINALYSIS DIPSTICK OB
Glucose, UA: NEGATIVE
POC,PROTEIN,UA: NEGATIVE

## 2020-01-17 ENCOUNTER — Telehealth: Payer: Self-pay | Admitting: Certified Nurse Midwife

## 2020-01-17 NOTE — Telephone Encounter (Signed)
Patient is scheduled for 01/21/20 for her 28 week labs and ROB. Would you place lab order?

## 2020-01-20 ENCOUNTER — Other Ambulatory Visit: Payer: Self-pay | Admitting: Certified Nurse Midwife

## 2020-01-20 DIAGNOSIS — Z130 Encounter for screening for diseases of the blood and blood-forming organs and certain disorders involving the immune mechanism: Secondary | ICD-10-CM

## 2020-01-20 DIAGNOSIS — Z113 Encounter for screening for infections with a predominantly sexual mode of transmission: Secondary | ICD-10-CM

## 2020-01-20 DIAGNOSIS — Z3402 Encounter for supervision of normal first pregnancy, second trimester: Secondary | ICD-10-CM

## 2020-01-20 DIAGNOSIS — Z131 Encounter for screening for diabetes mellitus: Secondary | ICD-10-CM

## 2020-01-20 DIAGNOSIS — Z3A28 28 weeks gestation of pregnancy: Secondary | ICD-10-CM

## 2020-01-20 NOTE — Telephone Encounter (Signed)
done

## 2020-01-21 ENCOUNTER — Other Ambulatory Visit: Payer: Self-pay

## 2020-01-21 ENCOUNTER — Other Ambulatory Visit: Payer: BC Managed Care – PPO

## 2020-01-21 ENCOUNTER — Ambulatory Visit (INDEPENDENT_AMBULATORY_CARE_PROVIDER_SITE_OTHER): Payer: BC Managed Care – PPO | Admitting: Obstetrics

## 2020-01-21 VITALS — BP 115/70 | Ht 61.0 in | Wt 155.4 lb

## 2020-01-21 DIAGNOSIS — Z131 Encounter for screening for diabetes mellitus: Secondary | ICD-10-CM

## 2020-01-21 DIAGNOSIS — Z3A28 28 weeks gestation of pregnancy: Secondary | ICD-10-CM | POA: Diagnosis not present

## 2020-01-21 DIAGNOSIS — O26893 Other specified pregnancy related conditions, third trimester: Secondary | ICD-10-CM | POA: Diagnosis not present

## 2020-01-21 DIAGNOSIS — Z6791 Unspecified blood type, Rh negative: Secondary | ICD-10-CM | POA: Diagnosis not present

## 2020-01-21 DIAGNOSIS — Z130 Encounter for screening for diseases of the blood and blood-forming organs and certain disorders involving the immune mechanism: Secondary | ICD-10-CM

## 2020-01-21 DIAGNOSIS — Z113 Encounter for screening for infections with a predominantly sexual mode of transmission: Secondary | ICD-10-CM

## 2020-01-21 DIAGNOSIS — Z3402 Encounter for supervision of normal first pregnancy, second trimester: Secondary | ICD-10-CM

## 2020-01-21 LAB — POCT URINALYSIS DIPSTICK OB
Glucose, UA: NEGATIVE
POC,PROTEIN,UA: NEGATIVE

## 2020-01-21 MED ORDER — RHO D IMMUNE GLOBULIN 1500 UNIT/2ML IJ SOSY
300.0000 ug | PREFILLED_SYRINGE | Freq: Once | INTRAMUSCULAR | Status: AC
  Administered 2020-01-21: 300 ug via INTRAMUSCULAR

## 2020-01-21 NOTE — Progress Notes (Signed)
  Routine Prenatal Care Visit  Subjective  Courtney Ramos is a 24 y.o. G1P0 at [redacted]w[redacted]d being seen today for ongoing prenatal care.  She is currently monitored for the following issues for this high-risk pregnancy and has Allergic rhinitis; Airway hyperreactivity; Headache, migraine; Encounter for supervision of normal first pregnancy in second trimester; and Abdominal pain during pregnancy in second trimester on their problem list.  ----------------------------------------------------------------------------------- Patient reports no complaints.  Her mood is overall good; she does have occasional depressive sxs or anxiety, but is self managing these with tools she leanred in therapy. Her husband is a strong support for her. 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 Using insoles and the proper shoes. Contractions: Not present. Vag. Bleeding: None.  Movement: Present. Leaking Fluid denies.  ----------------------------------------------------------------------------------- The following portions of the patient's history were reviewed and updated as appropriate: allergies, current medications, past family history, past medical history, past social history, past surgical history and problem list. Problem list updated.  Objective  Blood pressure 115/70, height 5\' 1"  (1.549 m), weight 155 lb 6.4 oz (70.5 kg), last menstrual period 06/28/2019. Pregravid weight 140 lb (63.5 kg) Total Weight Gain 15 lb 6.4 oz (6.985 kg) Urinalysis: Urine Protein    Urine Glucose    Fetal Status:     Movement: Present     General:  Alert, oriented and cooperative. Patient is in no acute distress.  Skin: Skin is warm and dry. No rash noted.   Cardiovascular: Normal heart rate noted  Respiratory: Normal respiratory effort, no problems with respiration noted  Abdomen: Soft, gravid, appropriate for gestational age. Pain/Pressure: Absent     Pelvic:  Cervical exam deferred          Extremities: Normal range of motion.     Mental Status: Normal mood and affect. Normal behavior. Normal judgment and thought content.   Assessment   24 y.o. G1P0 at [redacted]w[redacted]d by  04/14/2020, by Ultrasound presenting for routine prenatal visit  Plan   pregnancy Problems (from 12/04/19 to present)    No problems associated with this episode.       Preterm labor symptoms and general obstetric precautions including but not limited to vaginal bleeding, contractions, leaking of fluid and fetal movement were reviewed in detail with the patient. Please refer to After Visit Summary for other counseling recommendations.  U;traspound reviewed with her. It is a girl.  Rhogam given today.  Return in about 2 weeks (around 02/04/2020) for return OB.  04/05/2020, CNM  01/21/2020 11:01 AM

## 2020-01-21 NOTE — Addendum Note (Signed)
Addended by: Clement Husbands A on: 01/21/2020 11:38 AM   Modules accepted: Orders

## 2020-01-22 LAB — 28 WEEKS RH-PANEL
Antibody Screen: NEGATIVE
Basophils Absolute: 0 10*3/uL (ref 0.0–0.2)
Basos: 0 %
EOS (ABSOLUTE): 0.1 10*3/uL (ref 0.0–0.4)
Eos: 1 %
Gestational Diabetes Screen: 98 mg/dL (ref 65–139)
HIV Screen 4th Generation wRfx: NONREACTIVE
Hematocrit: 31.3 % — ABNORMAL LOW (ref 34.0–46.6)
Hemoglobin: 10.6 g/dL — ABNORMAL LOW (ref 11.1–15.9)
Immature Grans (Abs): 0.1 10*3/uL (ref 0.0–0.1)
Immature Granulocytes: 1 %
Lymphocytes Absolute: 1.1 10*3/uL (ref 0.7–3.1)
Lymphs: 10 %
MCH: 29.6 pg (ref 26.6–33.0)
MCHC: 33.9 g/dL (ref 31.5–35.7)
MCV: 87 fL (ref 79–97)
Monocytes Absolute: 0.6 10*3/uL (ref 0.1–0.9)
Monocytes: 5 %
Neutrophils Absolute: 9.2 10*3/uL — ABNORMAL HIGH (ref 1.4–7.0)
Neutrophils: 83 %
Platelets: 187 10*3/uL (ref 150–450)
RBC: 3.58 x10E6/uL — ABNORMAL LOW (ref 3.77–5.28)
RDW: 12.3 % (ref 11.7–15.4)
RPR Ser Ql: NONREACTIVE
WBC: 11.1 10*3/uL — ABNORMAL HIGH (ref 3.4–10.8)

## 2020-01-26 NOTE — Progress Notes (Signed)
Patient checked in then left without being seen. Decided to have a telephone visit, then provider unable to contact patient by phone. Reported good FM. Denied pain or contractions or vaginal bleeding BP 110/70 Weight up 13# in 4 weeks. Negative proteinuria.    Farrel Conners, CNM

## 2020-02-01 ENCOUNTER — Other Ambulatory Visit: Payer: Self-pay

## 2020-02-01 ENCOUNTER — Observation Stay
Admission: EM | Admit: 2020-02-01 | Discharge: 2020-02-02 | Disposition: A | Discharge status: Home / Self Care | Payer: BC Managed Care – PPO | Attending: Obstetrics & Gynecology | Admitting: Obstetrics & Gynecology

## 2020-02-01 ENCOUNTER — Encounter: Payer: Self-pay | Admitting: Obstetrics & Gynecology

## 2020-02-01 DIAGNOSIS — F4310 Post-traumatic stress disorder, unspecified: Secondary | ICD-10-CM | POA: Diagnosis not present

## 2020-02-01 DIAGNOSIS — Z3490 Encounter for supervision of normal pregnancy, unspecified, unspecified trimester: Secondary | ICD-10-CM

## 2020-02-01 DIAGNOSIS — N898 Other specified noninflammatory disorders of vagina: Secondary | ICD-10-CM | POA: Diagnosis present

## 2020-02-01 DIAGNOSIS — O26893 Other specified pregnancy related conditions, third trimester: Secondary | ICD-10-CM | POA: Diagnosis not present

## 2020-02-01 DIAGNOSIS — Z3A29 29 weeks gestation of pregnancy: Secondary | ICD-10-CM | POA: Diagnosis not present

## 2020-02-01 DIAGNOSIS — F319 Bipolar disorder, unspecified: Secondary | ICD-10-CM | POA: Diagnosis not present

## 2020-02-01 MED ORDER — CALCIUM CARBONATE ANTACID 500 MG PO CHEW
1.0000 | CHEWABLE_TABLET | ORAL | Status: DC | PRN
  Administered 2020-02-02: 200 mg via ORAL

## 2020-02-01 MED ORDER — ONDANSETRON HCL 4 MG/2ML IJ SOLN: 4.0000 mg | Freq: Four times a day (QID) | INTRAMUSCULAR | Status: DC | PRN

## 2020-02-01 MED ORDER — LIDOCAINE HCL (PF) 1 % IJ SOLN: 30.0000 mL | INTRAMUSCULAR | Status: DC | PRN

## 2020-02-01 MED ORDER — ACETAMINOPHEN 325 MG PO TABS
650.0000 mg | ORAL_TABLET | ORAL | Status: DC | PRN
  Administered 2020-02-02: 650 mg via ORAL
  Filled 2020-02-01: qty 2

## 2020-02-01 NOTE — OB Triage Note (Signed)
Pt is a G1P0 and [redacted]w[redacted]d presenting to L&D with c/o loss of mucus plug at 1930 tonight and cramping since about 1600. Pt states it was "clear and cloudy white gel like clump." Pt states cramping has not increased from baseline as she states she has had cramping during this pregnancy and rates it 2/10 when it occurs. Pt states her job is physically demanding and states it happened while she was at work. Pt also stated her last intercourse was 8AM this morning before work and that she has hydrated adequately. Pt denies LOF or VB and confirms positive fetal movement. Initial FHT 140. VSS. Monitors applied and assessing. No further needs at this time.

## 2020-02-01 NOTE — Final Progress Note (Signed)
Physician Final Progress Note  Patient ID: Courtney Ramos MRN: [id number] DOB/AGE: June 06, 1996 23 y.o.  Admit date: 02/01/2020 Admitting provider: Nadara Mustard, MD Discharge date: 02/02/2020   Admission Diagnoses: Vaginal discharge at 29 weeks and concern for preterm labor and/or PPROM  Discharge Diagnoses:  Active Problems:   Vaginal discharge   Pregnancy    Consults: None  Significant Findings/ Diagnostic Studies:  Obstetrics Admission History & Physical   No chief complaint on file.   HPI:  24 y.o. G1P0 @ [redacted]w[redacted]d (04/14/2020, by Ultrasound). Admitted on 02/01/2020:   Patient Active Problem List   Diagnosis Date Noted   Pregnancy 02/02/2020   Vaginal discharge 02/01/2020   Abdominal pain during pregnancy in second trimester 12/21/2019   Encounter for supervision of normal first pregnancy in second trimester 12/04/2019   Allergic rhinitis 12/03/2015   Airway hyperreactivity 12/03/2015   Headache, migraine 12/03/2015     Presents for vag discharge discharge as mucus like after work today (works 8 hour shifts at [employer] on feet a lot, first day back) and sex this am, no fever or watery fluid or bleeding or ctx pains.  Mild cramping rarely since this happened at 2000.  Pregnnacy no other complications..   Prenatal care at: at [facility]. Pregnancy complicated by none.  ROS: A review of systems was performed and negative, except as stated in the above HPI. PMHx:  Past Medical History:  Diagnosis Date   Anxiety    Asthma    Bipolar 2 disorder (HCC)    Depression    Endometriosis    Hyperemesis    PTSD (post-traumatic stress disorder)    PSHx:  Past Surgical History:  Procedure Laterality Date   NO PAST SURGERIES     Medications:  No medications prior to admission.   Allergies: has No Known Allergies. OBHx:  OB History  Gravida Para Term Preterm AB Living  1            SAB TAB Ectopic Multiple Live Births               # Outcome Date GA Lbr  Len/2nd Weight Sex Delivery Anes PTL Lv  1 Current            TMA:UQJFHLKT/GYBWLSLHTDSK except as detailed in HPI.Marland Kitchen  No family history of birth defects. Soc Hx: Never smoker, no drugs or EtOH  Objective:   Vitals:   02/01/20 2149  BP: (!) 124/63  Pulse: 80  Resp: 16  Temp: 98.6 F (37 C)   Constitutional: Well nourished, well developed female in no acute distress.  HEENT: normal Skin: Warm and dry.  Cardiovascular:Regular rate and rhythm.   Extremity: trace to 1+ bilateral pedal edema Respiratory: Clear to auscultation bilateral. Normal respiratory effort Abdomen: gravid, ND, FHT present, without guarding, without rebound tenderness on exam Back: no CVAT Neuro: DTRs 2+, Cranial nerves grossly intact Psych: Alert and Oriented x3. No memory deficits. Normal mood and affect.  MS: normal gait, normal bilateral lower extremity ROM/strength/stability.  Pelvic exam: is not limited by body habitus EGBUS: within normal limits Vagina: within normal limits and with normal mucosa Cervix: cervix closed thick high Uterus: No contractions observed for 30 minutes.  Adnexa: not evaluated  Assessment & Plan:   24 y.o. G1P0 @ [redacted]w[redacted]d, Admitted on 02/01/2020:Vaginal discharge, not likely labor based on exam and toco, just cervical mucus     Procedures: A NST procedure was performed with FHR monitoring and a normal baseline established, appropriate time of 20-40  minutes of evaluation, and accels >2 seen w 10x10 characteristics.  Results show a REACTIVE NST.    Discharge Condition: good  Disposition:  Discharge disposition: 01-Home or Self Care       Diet: Regular diet  Discharge Activity: Activity as tolerated   Allergies as of 02/02/2020   No Known Allergies     Medication List    ASK your doctor about these medications   butalbital-acetaminophen-caffeine 50-325-40 MG tablet Commonly known as: FIORICET   hydrOXYzine 50 MG tablet Commonly known as: ATARAX/VISTARIL    magnesium 30 MG tablet Take 30 mg by mouth 2 (two) times daily.   multivitamin-prenatal 27-0.8 MG Tabs tablet Take 1 tablet by mouth daily at 12 noon.        Total time spent taking care of this patient: 20 minutes  Signed: Letitia Libra 02/02/2020, 8:42 AM

## 2020-02-01 NOTE — Discharge Instructions (Signed)

## 2020-02-01 NOTE — Discharge Summary (Signed)
seeFPN  

## 2020-02-02 ENCOUNTER — Observation Stay
Admission: EM | Admit: 2020-02-02 | Discharge: 2020-02-02 | Disposition: A | Discharge status: Home / Self Care | Payer: BC Managed Care – PPO | Source: Home / Self Care | Admitting: Obstetrics & Gynecology

## 2020-02-02 DIAGNOSIS — O26893 Other specified pregnancy related conditions, third trimester: Secondary | ICD-10-CM | POA: Diagnosis not present

## 2020-02-02 DIAGNOSIS — O4693 Antepartum hemorrhage, unspecified, third trimester: Secondary | ICD-10-CM | POA: Insufficient documentation

## 2020-02-02 DIAGNOSIS — Z3490 Encounter for supervision of normal pregnancy, unspecified, unspecified trimester: Secondary | ICD-10-CM

## 2020-02-02 LAB — FETAL FIBRONECTIN: Fetal Fibronectin: NEGATIVE

## 2020-02-02 MED ORDER — ACETAMINOPHEN 325 MG PO TABS: 650.0000 mg | ORAL_TABLET | ORAL | Status: DC | PRN

## 2020-02-02 MED ORDER — ONDANSETRON HCL 4 MG/2ML IJ SOLN: 4.0000 mg | Freq: Four times a day (QID) | INTRAMUSCULAR | Status: DC | PRN

## 2020-02-02 MED ORDER — CALCIUM CARBONATE ANTACID 500 MG PO CHEW
CHEWABLE_TABLET | ORAL | Status: AC
  Filled 2020-02-02: qty 1

## 2020-02-02 MED ORDER — LIDOCAINE HCL (PF) 1 % IJ SOLN: 30.0000 mL | INTRAMUSCULAR | Status: DC | PRN

## 2020-02-02 NOTE — Discharge Summary (Signed)
See fn

## 2020-02-02 NOTE — OB Triage Note (Signed)
Discharge instructions reviewed and pt verbalized understanding. Pt states she has an appt this coming week. No further needs at this time.

## 2020-02-02 NOTE — OB Triage Note (Signed)
Patient here for bleeding when wiping several hours ago with multiple small clots on the tissue. The biggest was the size of a quarter and it wad BRB now pink tinged when wiping with some cramping. Patient is not having to wear a pad.

## 2020-02-02 NOTE — Final Progress Note (Signed)
Physician Final Progress Note  Patient ID: Courtney Ramos MRN: [id number] DOB/AGE: 24-Oct-1995 24 y.o.  Admit date: 02/02/2020 Admitting provider: Nadara Mustard, MD Discharge date: 02/02/2020  Admission Diagnoses: Active Problems:   Third trimester bleeding  Discharge Diagnoses:  Active Problems:   Third trimester bleeding   Consults: None  Significant Findings/ Diagnostic Studies: Patient presented for evaluation of labor.  Patient had cervical exam by RN and this was reported to me. I reviewed her vital signs and fetal tracing, both of which were reassuring.  Patient was discharge as she was not laboring.   Procedures: A NST procedure was performed with FHR monitoring and a normal baseline established, appropriate time of 20-40 minutes of evaluation, and accels >2 seen w 15x15 characteristics.  Results show a REACTIVE NST.     Discharge Condition: good  Disposition: Discharge disposition: 01-Home or Self Care       Diet: Regular diet  Discharge Activity: Activity as tolerated  Discharge Instructions    Call MD for:   Complete by: As directed    Worsening contractions or pain; leakage of fluid; bleeding.   Diet - low sodium heart healthy   Complete by: As directed    Increase activity slowly   Complete by: As directed      Allergies as of 02/02/2020   No Known Allergies     Medication List    TAKE these medications   butalbital-acetaminophen-caffeine 50-325-40 MG tablet Commonly known as: FIORICET   hydrOXYzine 50 MG tablet Commonly known as: ATARAX/VISTARIL   magnesium 30 MG tablet Take 30 mg by mouth 2 (two) times daily.   multivitamin-prenatal 27-0.8 MG Tabs tablet Take 1 tablet by mouth daily at 12 noon.        Total time spent taking care of this patient: TRIAGE Signed: Letitia Libra 02/02/2020, 2:05 PM

## 2020-02-05 ENCOUNTER — Other Ambulatory Visit: Payer: Self-pay

## 2020-02-05 ENCOUNTER — Encounter: Payer: Self-pay | Admitting: Obstetrics & Gynecology

## 2020-02-05 ENCOUNTER — Ambulatory Visit (INDEPENDENT_AMBULATORY_CARE_PROVIDER_SITE_OTHER): Payer: BC Managed Care – PPO | Admitting: Obstetrics & Gynecology

## 2020-02-05 VITALS — BP 110/60 | Wt 157.0 lb

## 2020-02-05 DIAGNOSIS — Z3403 Encounter for supervision of normal first pregnancy, third trimester: Secondary | ICD-10-CM

## 2020-02-05 DIAGNOSIS — Z23 Encounter for immunization: Secondary | ICD-10-CM

## 2020-02-05 DIAGNOSIS — Z3A30 30 weeks gestation of pregnancy: Secondary | ICD-10-CM

## 2020-02-05 NOTE — Patient Instructions (Signed)
Third Trimester of Pregnancy The third trimester is from week 28 through week 40 (months 7 through 9). The third trimester is a time when the unborn baby (fetus) is growing rapidly. At the end of the ninth month, the fetus is about 20 inches in length and weighs 6-10 pounds. Body changes during your third trimester Your body will continue to go through many changes during pregnancy. The changes vary from woman to woman. During the third trimester:  Your weight will continue to increase. You can expect to gain 25-35 pounds (11-16 kg) by the end of the pregnancy.  You may begin to get stretch marks on your hips, abdomen, and breasts.  You may urinate more often because the fetus is moving lower into your pelvis and pressing on your bladder.  You may develop or continue to have heartburn. This is caused by increased hormones that slow down muscles in the digestive tract.  You may develop or continue to have constipation because increased hormones slow digestion and cause the muscles that push waste through your intestines to relax.  You may develop hemorrhoids. These are swollen veins (varicose veins) in the rectum that can itch or be painful.  You may develop swollen, bulging veins (varicose veins) in your legs.  You may have increased body aches in the pelvis, back, or thighs. This is due to weight gain and increased hormones that are relaxing your joints.  You may have changes in your hair. These can include thickening of your hair, rapid growth, and changes in texture. Some women also have hair loss during or after pregnancy, or hair that feels dry or thin. Your hair will most likely return to normal after your baby is born.  Your breasts will continue to grow and they will continue to become tender. A yellow fluid (colostrum) may leak from your breasts. This is the first milk you are producing for your baby.  Your belly button may stick out.  You may notice more swelling in your hands,  face, or ankles.  You may have increased tingling or numbness in your hands, arms, and legs. The skin on your belly may also feel numb.  You may feel short of breath because of your expanding uterus.  You may have more problems sleeping. This can be caused by the size of your belly, increased need to urinate, and an increase in your body's metabolism.  You may notice the fetus "dropping," or moving lower in your abdomen (lightening).  You may have increased vaginal discharge.  You may notice your joints feel loose and you may have pain around your pelvic bone. What to expect at prenatal visits You will have prenatal exams every 2 weeks until week 36. Then you will have weekly prenatal exams. During a routine prenatal visit:  You will be weighed to make sure you and the baby are growing normally.  Your blood pressure will be taken.  Your abdomen will be measured to track your baby's growth.  The fetal heartbeat will be listened to.  Any test results from the previous visit will be discussed.  You may have a cervical check near your due date to see if your cervix has softened or thinned (effaced).  You will be tested for Group B streptococcus. This happens between 35 and 37 weeks. Your health care provider may ask you:  What your birth plan is.  How you are feeling.  If you are feeling the baby move.  If you have had any abnormal   symptoms, such as leaking fluid, bleeding, severe headaches, or abdominal cramping.  If you are using any tobacco products, including cigarettes, chewing tobacco, and electronic cigarettes.  If you have any questions. Other tests or screenings that may be performed during your third trimester include:  Blood tests that check for low iron levels (anemia).  Fetal testing to check the health, activity level, and growth of the fetus. Testing is done if you have certain medical conditions or if there are problems during the pregnancy.  Nonstress test  (NST). This test checks the health of your baby to make sure there are no signs of problems, such as the baby not getting enough oxygen. During this test, a belt is placed around your belly. The baby is made to move, and its heart rate is monitored during movement. What is false labor? False labor is a condition in which you feel small, irregular tightenings of the muscles in the womb (contractions) that usually go away with rest, changing position, or drinking water. These are called Braxton Hicks contractions. Contractions may last for hours, days, or even weeks before true labor sets in. If contractions come at regular intervals, become more frequent, increase in intensity, or become painful, you should see your health care provider. What are the signs of labor?  Abdominal cramps.  Regular contractions that start at 10 minutes apart and become stronger and more frequent with time.  Contractions that start on the top of the uterus and spread down to the lower abdomen and back.  Increased pelvic pressure and dull back pain.  A watery or bloody mucus discharge that comes from the vagina.  Leaking of amniotic fluid. This is also known as your "water breaking." It could be a slow trickle or a gush. Let your health care provider know if it has a color or strange odor. If you have any of these signs, call your health care provider right away, even if it is before your due date. Follow these instructions at home: Medicines  Follow your health care provider's instructions regarding medicine use. Specific medicines may be either safe or unsafe to take during pregnancy.  Take a prenatal vitamin that contains at least 600 micrograms (mcg) of folic acid.  If you develop constipation, try taking a stool softener if your health care provider approves. Eating and drinking   Eat a balanced diet that includes fresh fruits and vegetables, whole grains, good sources of protein such as meat, eggs, or tofu,  and low-fat dairy. Your health care provider will help you determine the amount of weight gain that is right for you.  Avoid raw meat and uncooked cheese. These carry germs that can cause birth defects in the baby.  If you have low calcium intake from food, talk to your health care provider about whether you should take a daily calcium supplement.  Eat four or five small meals rather than three large meals a day.  Limit foods that are high in fat and processed sugars, such as fried and sweet foods.  To prevent constipation: ? Drink enough fluid to keep your urine clear or pale yellow. ? Eat foods that are high in fiber, such as fresh fruits and vegetables, whole grains, and beans. Activity  Exercise only as directed by your health care provider. Most women can continue their usual exercise routine during pregnancy. Try to exercise for 30 minutes at least 5 days a week. Stop exercising if you experience uterine contractions.  Avoid heavy lifting.  Do   not exercise in extreme heat or humidity, or at high altitudes.  Wear low-heel, comfortable shoes.  Practice good posture.  You may continue to have sex unless your health care provider tells you otherwise. Relieving pain and discomfort  Take frequent breaks and rest with your legs elevated if you have leg cramps or low back pain.  Take warm sitz baths to soothe any pain or discomfort caused by hemorrhoids. Use hemorrhoid cream if your health care provider approves.  Wear a good support bra to prevent discomfort from breast tenderness.  If you develop varicose veins: ? Wear support pantyhose or compression stockings as told by your healthcare provider. ? Elevate your feet for 15 minutes, 3-4 times a day. Prenatal care  Write down your questions. Take them to your prenatal visits.  Keep all your prenatal visits as told by your health care provider. This is important. Safety  Wear your seat belt at all times when driving.  Make  a list of emergency phone numbers, including numbers for family, friends, the hospital, and police and fire departments. General instructions  Avoid cat litter boxes and soil used by cats. These carry germs that can cause birth defects in the baby. If you have a cat, ask someone to clean the litter box for you.  Do not travel far distances unless it is absolutely necessary and only with the approval of your health care provider.  Do not use hot tubs, steam rooms, or saunas.  Do not drink alcohol.  Do not use any products that contain nicotine or tobacco, such as cigarettes and e-cigarettes. If you need help quitting, ask your health care provider.  Do not use any medicinal herbs or unprescribed drugs. These chemicals affect the formation and growth of the baby.  Do not douche or use tampons or scented sanitary pads.  Do not cross your legs for long periods of time.  To prepare for the arrival of your baby: ? Take prenatal classes to understand, practice, and ask questions about labor and delivery. ? Make a trial run to the hospital. ? Visit the hospital and tour the maternity area. ? Arrange for maternity or paternity leave through employers. ? Arrange for family and friends to take care of pets while you are in the hospital. ? Purchase a rear-facing car seat and make sure you know how to install it in your car. ? Pack your hospital bag. ? Prepare the baby's nursery. Make sure to remove all pillows and stuffed animals from the baby's crib to prevent suffocation.  Visit your dentist if you have not gone during your pregnancy. Use a soft toothbrush to brush your teeth and be gentle when you floss. Contact a health care provider if:  You are unsure if you are in labor or if your water has broken.  You become dizzy.  You have mild pelvic cramps, pelvic pressure, or nagging pain in your abdominal area.  You have lower back pain.  You have persistent nausea, vomiting, or  diarrhea.  You have an unusual or bad smelling vaginal discharge.  You have pain when you urinate. Get help right away if:  Your water breaks before 37 weeks.  You have regular contractions less than 5 minutes apart before 37 weeks.  You have a fever.  You are leaking fluid from your vagina.  You have spotting or bleeding from your vagina.  You have severe abdominal pain or cramping.  You have rapid weight loss or weight gain.  You have   shortness of breath with chest pain.  You notice sudden or extreme swelling of your face, hands, ankles, feet, or legs.  Your baby makes fewer than 10 movements in 2 hours.  You have severe headaches that do not go away when you take medicine.  You have vision changes. Summary  The third trimester is from week 28 through week 40, months 7 through 9. The third trimester is a time when the unborn baby (fetus) is growing rapidly.  During the third trimester, your discomfort may increase as you and your baby continue to gain weight. You may have abdominal, leg, and back pain, sleeping problems, and an increased need to urinate.  During the third trimester your breasts will keep growing and they will continue to become tender. A yellow fluid (colostrum) may leak from your breasts. This is the first milk you are producing for your baby.  False labor is a condition in which you feel small, irregular tightenings of the muscles in the womb (contractions) that eventually go away. These are called Braxton Hicks contractions. Contractions may last for hours, days, or even weeks before true labor sets in.  Signs of labor can include: abdominal cramps; regular contractions that start at 10 minutes apart and become stronger and more frequent with time; watery or bloody mucus discharge that comes from the vagina; increased pelvic pressure and dull back pain; and leaking of amniotic fluid. This information is not intended to replace advice given to you by your  health care provider. Make sure you discuss any questions you have with your health care provider. Document Revised: 10/11/2018 Document Reviewed: 07/26/2016 Elsevier Patient Education  2020 [employer].  Thank you for choosing [facility]. As part of our ongoing efforts to improve patient experience, we would appreciate your feedback. Please fill out the short survey that you will receive by mail or MyChart. Your opinion is important to [place]! -Dr Tiburcio Pea

## 2020-02-05 NOTE — Progress Notes (Signed)
  Subjective  Fetal Movement? yes Contractions? no Leaking Fluid? no Vaginal Bleeding? no NO FURTHER BLEEDING and MIN pains related to position Has not been raking vit due to n/v after each time she tries to take Labs reviewed Objective  BP 110/60   Wt 157 lb (71.2 kg)   LMP 06/28/2019   BMI 29.66 kg/m  General: NAD Pumonary: no increased work of breathing Abdomen: gravid, non-tender Extremities: no edema Psychiatric: mood appropriate, affect full  Assessment  24 y.o. G1P0 at [redacted]w[redacted]d by  04/14/2020, by Ultrasound presenting for routine prenatal visit  Plan   Problem List Items Addressed This Visit      Other   Encounter for supervision of normal first pregnancy in second trimester   Pregnancy - Primary    Other Visit Diagnoses    Need for Tdap vaccination         Samples PNV given and will change to one she tolerates so that she will take more regularly     Will call w preference Also iron daily for anemia (Ferralet 90 samples, will see if tolerates then Rx) [facility] Monitor for further bleeding eposides Pt reports they plan to move back to [place] soon for remainder of pregnancy   Annamarie Major, MD, [facility], [facility] 02/05/2020  9:58 AM

## 2020-02-12 ENCOUNTER — Ambulatory Visit (INDEPENDENT_AMBULATORY_CARE_PROVIDER_SITE_OTHER): Payer: BC Managed Care – PPO | Admitting: Certified Nurse Midwife

## 2020-02-12 ENCOUNTER — Other Ambulatory Visit: Payer: Self-pay

## 2020-02-12 VITALS — BP 112/72 | Wt 161.0 lb

## 2020-02-12 DIAGNOSIS — Z3403 Encounter for supervision of normal first pregnancy, third trimester: Secondary | ICD-10-CM

## 2020-02-12 DIAGNOSIS — Z3A31 31 weeks gestation of pregnancy: Secondary | ICD-10-CM

## 2020-02-12 LAB — POCT URINALYSIS DIPSTICK OB
Glucose, UA: NEGATIVE
POC,PROTEIN,UA: NEGATIVE

## 2020-02-12 MED ORDER — PRENATE MINI 18-0.6-0.4-350 MG PO CAPS: 1.0000 | ORAL_CAPSULE | Freq: Every day | ORAL | 11 refills | Status: AC

## 2020-02-12 MED ORDER — FERRALET 90 90-1 MG PO TABS: 1.0000 | ORAL_TABLET | Freq: Every day | ORAL | 3 refills | Status: AC

## 2020-02-12 NOTE — Progress Notes (Signed)
C/o needs refills of pnv and ferralet 90; no more bleeding; no pains; taking tums for heartburn - adv elevate HOB, avoid spicey/fried foods, drink 1/4 c milk at HS; backpain; moving 8/23rd.

## 2020-02-18 ENCOUNTER — Ambulatory Visit (INDEPENDENT_AMBULATORY_CARE_PROVIDER_SITE_OTHER): Payer: BC Managed Care – PPO | Admitting: Obstetrics

## 2020-02-18 ENCOUNTER — Encounter: Payer: Self-pay | Admitting: Obstetrics

## 2020-02-18 ENCOUNTER — Other Ambulatory Visit: Payer: Self-pay

## 2020-02-18 VITALS — BP 118/74 | Wt 163.0 lb

## 2020-02-18 DIAGNOSIS — Z3403 Encounter for supervision of normal first pregnancy, third trimester: Secondary | ICD-10-CM

## 2020-02-18 DIAGNOSIS — Z3402 Encounter for supervision of normal first pregnancy, second trimester: Secondary | ICD-10-CM

## 2020-02-18 DIAGNOSIS — Z3A32 32 weeks gestation of pregnancy: Secondary | ICD-10-CM

## 2020-02-18 NOTE — Progress Notes (Signed)
  Routine Prenatal Care Visit  Subjective  Courtney Ramos is a 24 y.o. G1P0 at [redacted]w[redacted]d being seen today for ongoing prenatal care.  She is currently monitored for the following issues for this high-risk pregnancy and has Allergic rhinitis; Airway hyperreactivity; Headache, migraine; Encounter for supervision of normal first pregnancy in second trimester; Abdominal pain during pregnancy in second trimester; Vaginal discharge; Pregnancy; and Third trimester bleeding on their problem list.  ----------------------------------------------------------------------------------- Patient reports no complaints.  She is moving back to [place]. Her grandmother is giving them a farm house, and Courtney Ramos is returning to her previous OB. She denies any problems, and has arranged for her records to be sent to the other practice. Contractions: Not present. Vag. Bleeding: None.   . Leaking Fluid denies.  ----------------------------------------------------------------------------------- The following portions of the patient's history were reviewed and updated as appropriate: allergies, current medications, past family history, past medical history, past social history, past surgical history and problem list. Problem list updated.  Objective  Blood pressure 118/74, weight 163 lb (73.9 kg), last menstrual period 06/28/2019. Pregravid weight 140 lb (63.5 kg) Total Weight Gain 23 lb (10.4 kg) Urinalysis: Urine Protein    Urine Glucose    Fetal Status:           General:  Alert, oriented and cooperative. Patient is in no acute distress.  Skin: Skin is warm and dry. No rash noted.   Cardiovascular: Normal heart rate noted  Respiratory: Normal respiratory effort, no problems with respiration noted  Abdomen: Soft, gravid, appropriate for gestational age. Pain/Pressure: Absent     Pelvic:  Cervical exam deferred        Extremities: Normal range of motion.     Mental Status: Normal mood and affect. Normal behavior. Normal  judgment and thought content.   Assessment   24 y.o. G1P0 at [redacted]w[redacted]d by  04/14/2020, by Ultrasound presenting for routine prenatal visit  Plan   pregnancy Problems (from 12/04/19 to present)    No problems associated with this episode.       Preterm labor symptoms and general obstetric precautions including but not limited to vaginal bleeding, contractions, leaking of fluid and fetal movement were reviewed in detail with the patient. Please refer to After Visit Summary for other counseling recommendations.  She has had some borderline iron counts, so samples of iron are provided today to her. Instructions re her long drive to [place] provided.  Return in about 2 weeks (around 03/03/2020) for return OB.  Courtney Ramos, CNM  02/18/2020 10:03 AM

## 2020-02-18 NOTE — Progress Notes (Signed)
No vb. No lof. Giving pt some samples of PNV bc insu would not cover the ones she was on.

## 2020-02-23 NOTE — Progress Notes (Signed)
ROB at 31wk1d: Doing well. Will be moving back to [place]. Taking Prenate mini and Ferralet 90. Last hemoglobin 10.6 gm/dl. Baby active. Will be breast feeding.    Exam: FH 31 cm. FHT 152. BP 112/72 negative proteinuria.   A: IUP 31wk1d S=D  P: ROB in 1 week Childbirth classes ans breast feeding classes recommended Breast feeding  Farrel Conners, CNM
